# Patient Record
Sex: Female | Born: 2000 | Hispanic: Yes | Marital: Single | Smoking: Never smoker
Health system: Southern US, Community
[De-identification: ages and names within clinical notes are randomized; demographics above are authoritative.]

---
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
## 2018-07-02 IMAGING — DX DG CHEST 2V
2 series · 2 of 2 positions shown · non-contrast
Comparison: Chest radiograph performed 01/27/2010

CLINICAL DATA: Acute onset of generalized chest pain and productive
cough. Initial encounter.

EXAM:
CHEST  2 VIEW

[chest pa]
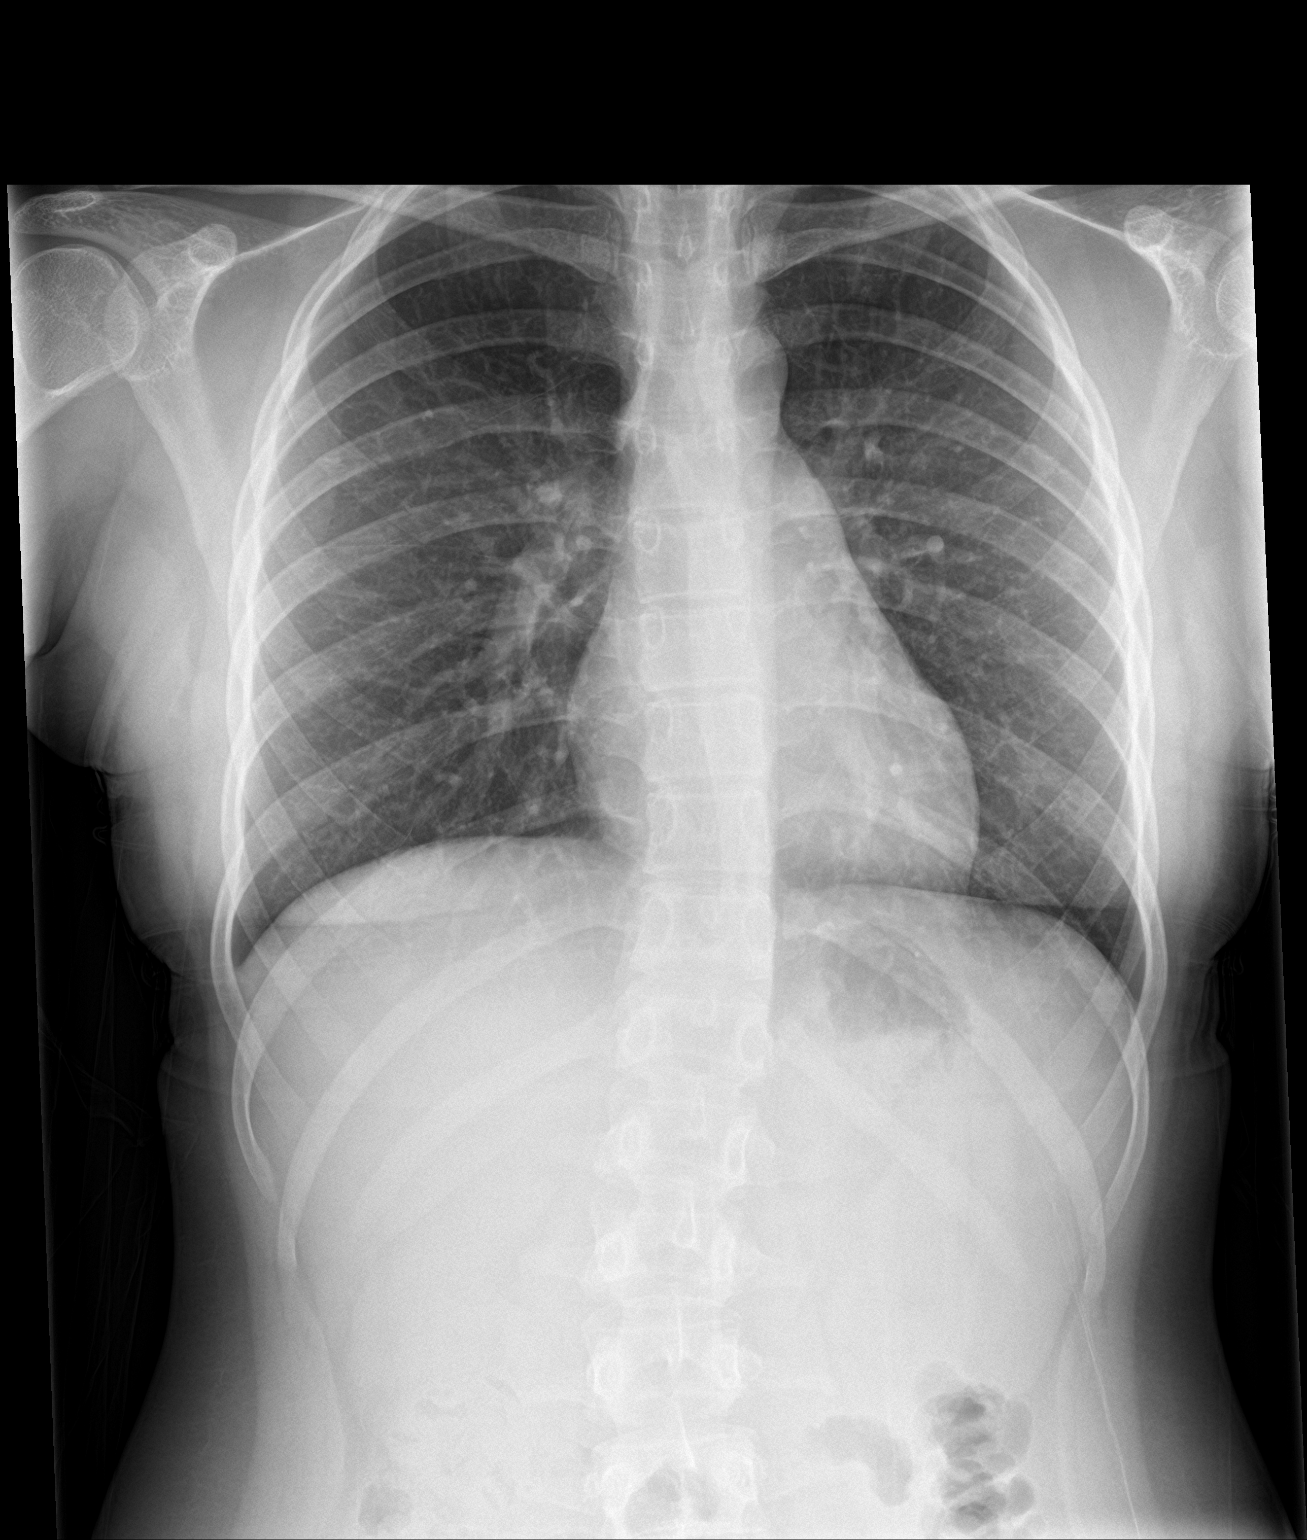

[chest lat]
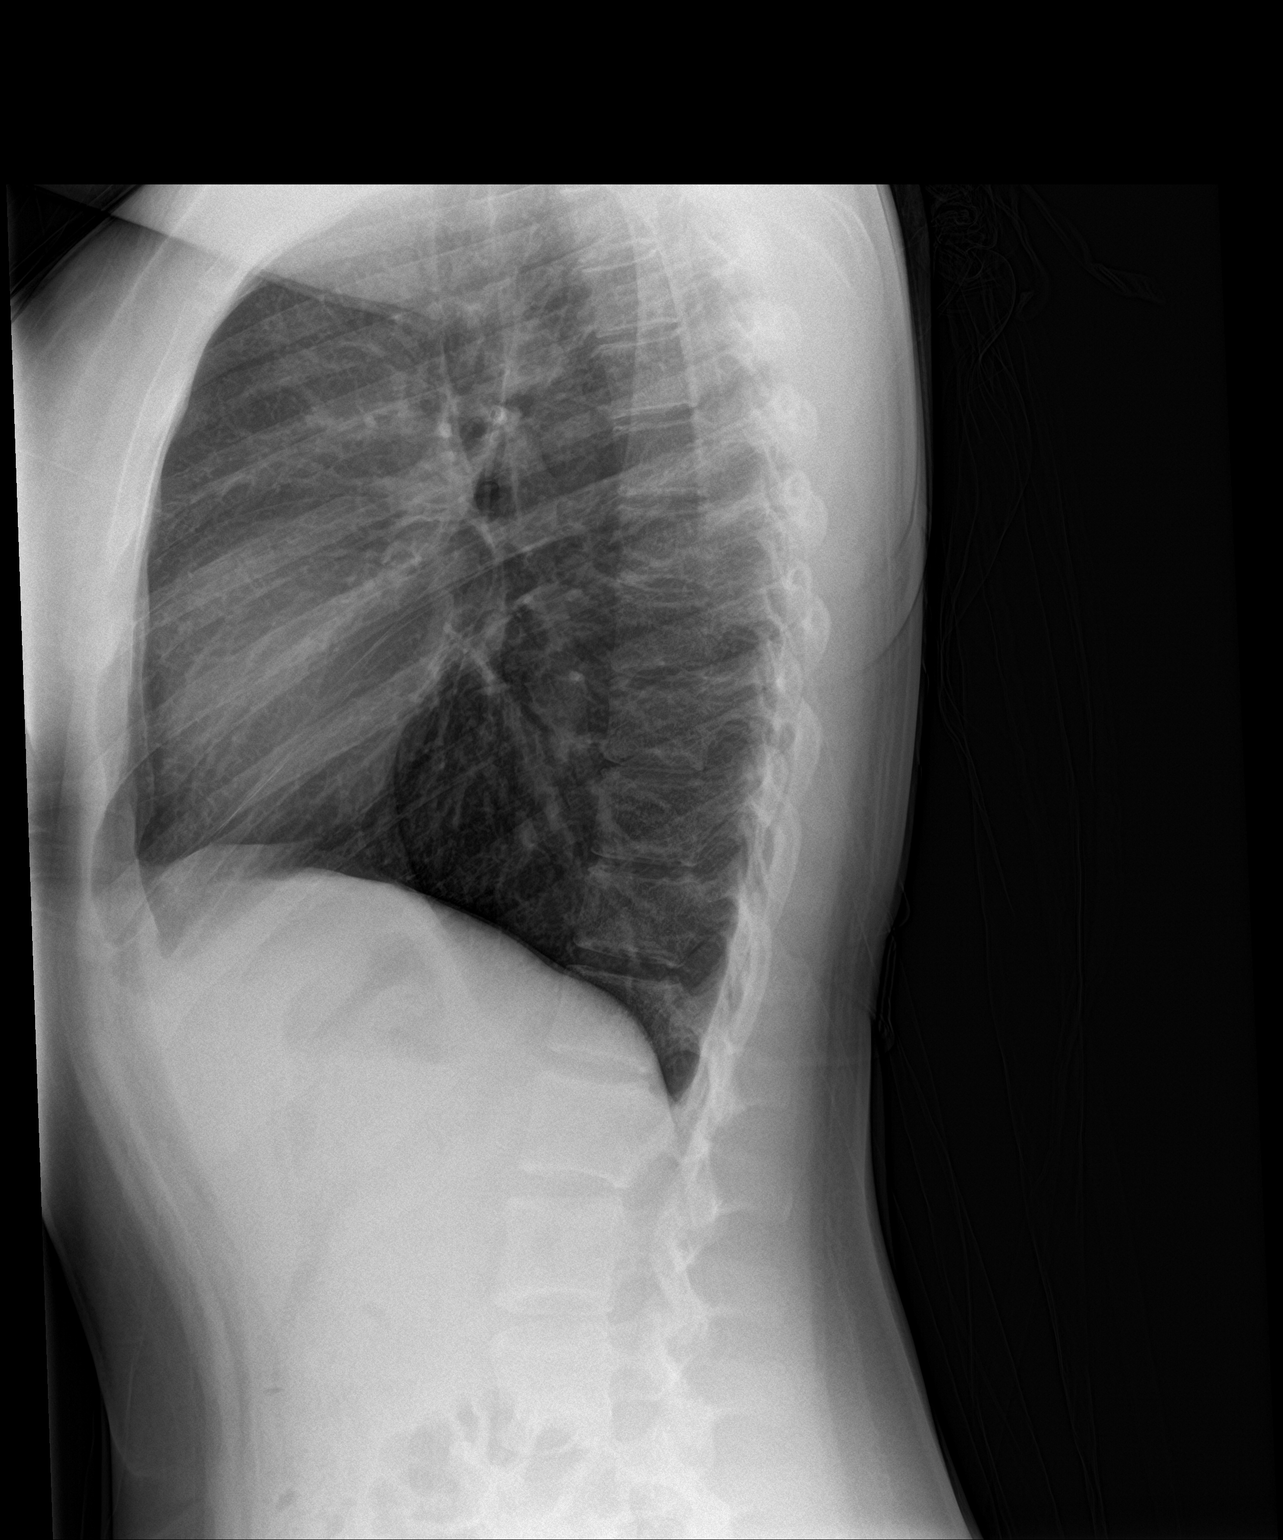

[2 of 2 positions shown; findings below may reference images not displayed]

FINDINGS: The lungs are well-aerated and clear. There is no evidence of focal
opacification, pleural effusion or pneumothorax.

The heart is normal in size; the mediastinal contour is within
normal limits. No acute osseous abnormalities are seen.
IMPRESSION: No acute cardiopulmonary process seen.

## 2021-11-06 ENCOUNTER — Encounter (HOSPITAL_BASED_OUTPATIENT_CLINIC_OR_DEPARTMENT_OTHER): Payer: Self-pay

## 2021-11-06 ENCOUNTER — Emergency Department (HOSPITAL_BASED_OUTPATIENT_CLINIC_OR_DEPARTMENT_OTHER)
Admission: EM | Admit: 2021-11-06 | Discharge: 2021-11-07 | Disposition: A | Discharge status: Home / Self Care | Payer: Medicaid Other | Attending: Emergency Medicine | Admitting: Emergency Medicine

## 2021-11-06 ENCOUNTER — Other Ambulatory Visit: Payer: Self-pay

## 2021-11-06 DIAGNOSIS — K047 Periapical abscess without sinus: Secondary | ICD-10-CM | POA: Diagnosis not present

## 2021-11-06 DIAGNOSIS — K0889 Other specified disorders of teeth and supporting structures: Secondary | ICD-10-CM | POA: Diagnosis present

## 2021-11-06 NOTE — ED Triage Notes (Signed)
Patient here POV from Home.  Endorses Abscess to Upper Mid Anterior Gums. Visited Hydrologist and was given Amoxicillin for Abscess. Has Taken 4 total Doses of Antibiotics. Bleeding and Drainage noted Today which concerned Patient to Seek Ed Evaluation.   No Known Fevers.   NAD Noted during Triage. A&Ox4. GCS 15. Ambulatory.

## 2021-11-07 MED ORDER — CHLORHEXIDINE GLUCONATE 0.12 % MT SOLN: 10.0000 mL | Freq: Four times a day (QID) | OROMUCOSAL | 0 refills | Status: DC

## 2021-11-07 MED ORDER — LIDOCAINE-EPINEPHRINE 2 %-1:100000 IJ SOLN
1.7000 mL | Freq: Once | INTRAMUSCULAR | Status: AC
  Administered 2021-11-07: 1.7 mL via INTRADERMAL
  Filled 2021-11-07: qty 1.7

## 2021-11-07 NOTE — ED Provider Notes (Signed)
[facility] EMERGENCY DEPT Provider Note  CSN: [id number] Arrival date & time: 11/06/21 2124  Chief Complaint(s) Dental Problem  HPI Holly Kelly is a 21 y.o. female who presents to the emergency department for evaluation of purulent drainage from the front incisor.  Patient reports that she was seen by the dentist yesterday to be evaluated for root canal and told she had an infection.  She was prescribed antibiotics.  She had a follow-up appointment earlier in the day today.  Afterwards, she began noticing some swelling in the gumline.  Tonight she began having purulent and bloody discharge from the gumline prompting her visit.  The history is provided by the patient.    Past Medical History History reviewed. No pertinent past medical history. There are no problems to display for this patient.  Home Medication(s) Prior to Admission medications   Medication Sig Start Date End Date Taking? Authorizing Provider  chlorhexidine (PERIDEX) 0.12 % solution Use as directed 10 mLs in the mouth or throat 4 (four) times daily. 11/07/21  Yes Asher Torpey, Amadeo Garnet, MD                                                                                                                                    Allergies Patient has no known allergies.  Review of Systems Review of Systems As noted in HPI  Physical Exam Vital Signs  I have reviewed the triage vital signs BP 123/84   Pulse 64   Temp 99.1 F (37.3 C)   Resp 16   Ht 5' (1.524 m)   Wt 63.5 kg   SpO2 99%   BMI 27.34 kg/m   Physical Exam Vitals reviewed.  Constitutional:      General: She is not in acute distress.    Appearance: She is well-developed. She is not diaphoretic.  HENT:     Head: Normocephalic and atraumatic.     Right Ear: External ear normal.     Left Ear: External ear normal.     Nose: Nose normal.     Mouth/Throat:   Eyes:     General: No scleral icterus.    Conjunctiva/sclera: Conjunctivae  normal.  Neck:     Trachea: Phonation normal.  Cardiovascular:     Rate and Rhythm: Normal rate and regular rhythm.  Pulmonary:     Effort: Pulmonary effort is normal. No respiratory distress.     Breath sounds: No stridor.  Abdominal:     General: There is no distension.  Musculoskeletal:        General: Normal range of motion.     Cervical back: Normal range of motion.  Neurological:     Mental Status: She is alert and oriented to person, place, and time.  Psychiatric:        Behavior: Behavior normal.     ED Results and Treatments Labs (all labs ordered are listed, but only abnormal results are  displayed) Labs Reviewed - No data to display                                                                                                                       EKG  EKG Interpretation  Date/Time:    Ventricular Rate:    PR Interval:    QRS Duration:   QT Interval:    QTC Calculation:   R Axis:     Text Interpretation:         Radiology No results found.  Medications Ordered in ED Medications  lidocaine-EPINEPHrine (XYLOCAINE W/EPI) 2 %-1:100000 (with pres) injection 1.7 mL (1.7 mLs Intradermal Given by Other 11/07/21 0207)                                                                                                                                     Procedures .Marland KitchenIncision and Drainage  Date/Time: 11/07/2021 2:29 AM  Performed by: Nira Conn, MD Authorized by: Nira Conn, MD   Consent:    Consent obtained:  Verbal   Consent given by:  Patient   Risks discussed:  Bleeding   Alternatives discussed:  Alternative treatment Universal protocol:    Procedure explained and questions answered to patient or proxy's satisfaction: yes     Patient identity confirmed:  Verbally with patient and arm band Location:    Type:  Abscess   Location:  Mouth   Mouth location:  Peritonsillar Anesthesia:    Anesthesia method:  Local infiltration   Local  anesthetic:  Lidocaine 2% WITH epi Procedure type:    Complexity:  Simple Procedure details:    Incision types:  Single straight   Incision depth:  Subcutaneous   Drainage:  Bloody   Drainage amount:  Scant   Packing materials:  None Post-procedure details:    Procedure completion:  Tolerated   (including critical care time)  Medical Decision Making / ED Course   Medical Decision Making Risk Prescription drug management.    Dental abscess. Patient given option to I&D versus continue antibiotic.  Patient opted for I&D tonight.  Recommended she continue her prescription antibiotics.  Also provided with prescription for Peridex mouthwash. Dentistry follow-up      Final Clinical Impression(s) / ED Diagnoses Final diagnoses:  None   The patient appears reasonably screened and/or stabilized for discharge and I doubt any other medical condition or other [facility] requiring further  screening, evaluation, or treatment in the ED at this time. I have discussed the findings, Dx and Tx plan with the patient/family who expressed understanding and agree(s) with the plan. Discharge instructions discussed at length. The patient/family was given strict return precautions who verbalized understanding of the instructions. No further questions at time of discharge.  Disposition: Discharge  Condition: Good  ED Discharge Orders          Ordered    chlorhexidine (PERIDEX) 0.12 % solution  4 times daily        11/07/21 0110            Follow Up: Dentist  Call  to schedule an appointment for close follow up           This chart was dictated using voice recognition software.  Despite best efforts to proofread,  errors can occur which can change the documentation meaning.    Nira Conn, MD 11/07/21 [phone]

## 2021-11-07 NOTE — ED Notes (Signed)
Pt placed on monitor, explained delay and awaiting provider assessment. Pt requested water and rn explained that provider would need to see pt first. Denies further needs.

## 2023-07-14 ENCOUNTER — Encounter: Admitting: Nurse Practitioner

## 2023-07-21 ENCOUNTER — Other Ambulatory Visit (HOSPITAL_COMMUNITY)
Admission: RE | Admit: 2023-07-21 | Discharge: 2023-07-21 | Disposition: A | Discharge status: Home / Self Care | Source: Ambulatory Visit | Attending: Nurse Practitioner | Admitting: Nurse Practitioner

## 2023-07-21 ENCOUNTER — Ambulatory Visit: Admitting: Nurse Practitioner

## 2023-07-21 ENCOUNTER — Encounter: Payer: Self-pay | Admitting: Nurse Practitioner

## 2023-07-21 ENCOUNTER — Ambulatory Visit (INDEPENDENT_AMBULATORY_CARE_PROVIDER_SITE_OTHER): Admitting: Nurse Practitioner

## 2023-07-21 VITALS — BP 110/70 | HR 66 | Ht 61.5 in | Wt 139.0 lb

## 2023-07-21 DIAGNOSIS — Z1331 Encounter for screening for depression: Secondary | ICD-10-CM | POA: Diagnosis not present

## 2023-07-21 DIAGNOSIS — Z124 Encounter for screening for malignant neoplasm of cervix: Secondary | ICD-10-CM

## 2023-07-21 DIAGNOSIS — Z01419 Encounter for gynecological examination (general) (routine) without abnormal findings: Secondary | ICD-10-CM | POA: Insufficient documentation

## 2023-07-21 DIAGNOSIS — Z3009 Encounter for other general counseling and advice on contraception: Secondary | ICD-10-CM | POA: Diagnosis not present

## 2023-07-21 NOTE — Progress Notes (Signed)
 Holly Kelly 2000-08-03 [id number]   History:  23 y.o. G1P0010 presents as new patient to establish care. Nexplanon inserted March 2022. Scheduled for removal next week. Does not want to switch to alternative hormonal contraception at this time. Has been on birth control since age 31 and wants to take a break. In long term relationship. Normal pap history.   Gynecologic History No LMP recorded. Patient has had an implant.   Contraception/Family planning: Nexplanon Sexually active: Yes  Health Maintenance Last Pap: 2021. Results were: Normal Last mammogram: Not indicated Last colonoscopy: Not indicated Last Dexa: Not indicated     07/21/2023   11:31 AM  Depression screen PHQ 2/9  Decreased Interest 0  Down, Depressed, Hopeless 0  PHQ - 2 Score 0     Past medical history, past surgical history, family history and social history were all reviewed and documented in the EPIC chart. Long-term boyfriend. Works at [employer]. Lost parents 2 years ago. 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   ROS:  A ROS was performed and pertinent positives and negatives are included.  Exam:  Vitals:   07/21/23 1124  BP: 110/70  Pulse: 66  SpO2: 100%  Weight: 139 lb (63 kg)  Height: 5' 1.5" (1.562 m)   Body mass index is 25.84 kg/m.  General appearance:  Normal Thyroid:  Symmetrical, normal in size, without palpable masses or nodularity. Respiratory  Auscultation:  Clear without wheezing or rhonchi Cardiovascular  Auscultation:  Regular rate, without rubs, murmurs or gallops  Edema/varicosities:  Not grossly evident Abdominal  Soft,nontender, without masses, guarding or rebound.  Liver/spleen:  No organomegaly noted  Hernia:  None appreciated  Skin  Inspection:  Grossly normal Breasts: Not indicated per guidelines Pelvic: External genitalia:  no lesions              Urethra:  normal appearing urethra with no masses, tenderness or lesions              Bartholins and Skenes: normal                  Vagina: normal appearing vagina with normal color and discharge, no lesions              Cervix: no lesions Bimanual Exam:  Uterus:  no masses or tenderness              Adnexa: no mass, fullness, tenderness              Rectovaginal: Deferred              Anus:  normal, no lesions  Patient informed chaperone available to be present for breast and pelvic exam. Patient has requested no chaperone to be present. Patient has been advised what will be completed during breast and pelvic exam.   Assessment/Plan:  23 y.o. G1P0010 to establish care.   Well female exam with routine gynecological exam - Plan: Cytology - PAP( [place]). Education provided on SBEs, importance of preventative screenings, current guidelines, high calcium diet, regular exercise, and multivitamin daily.   Cervical cancer screening - Plan: Cytology - PAP( ). Normal pap history.   General counseling and advice on female contraception - Nexplanon inserted March 2022. Scheduled for removal next week. Does not want to switch to alternative hormonal contraception at this time. Has been on birth control since age 89 and wants to take a break. Provided information on Phexxi.   Return in about 1 year (around  07/20/2024) for Annual.    Andee Bamberger DNP, 11:51 AM 07/21/2023

## 2023-07-24 LAB — CYTOLOGY - PAP
Comment: NEGATIVE
Diagnosis: UNDETERMINED — AB
High risk HPV: NEGATIVE

## 2023-07-28 ENCOUNTER — Ambulatory Visit: Admitting: Nurse Practitioner

## 2023-07-29 ENCOUNTER — Ambulatory Visit: Payer: Self-pay | Admitting: Nurse Practitioner

## 2023-09-24 ENCOUNTER — Ambulatory Visit: Admitting: Nurse Practitioner

## 2023-11-12 ENCOUNTER — Emergency Department (HOSPITAL_BASED_OUTPATIENT_CLINIC_OR_DEPARTMENT_OTHER)

## 2023-11-12 ENCOUNTER — Encounter (HOSPITAL_BASED_OUTPATIENT_CLINIC_OR_DEPARTMENT_OTHER): Payer: Self-pay | Admitting: Emergency Medicine

## 2023-11-12 ENCOUNTER — Emergency Department (HOSPITAL_BASED_OUTPATIENT_CLINIC_OR_DEPARTMENT_OTHER)
Admission: EM | Admit: 2023-11-12 | Discharge: 2023-11-12 | Disposition: A | Discharge status: Home / Self Care | Attending: Emergency Medicine | Admitting: Emergency Medicine

## 2023-11-12 ENCOUNTER — Other Ambulatory Visit: Payer: Self-pay

## 2023-11-12 DIAGNOSIS — M79645 Pain in left finger(s): Secondary | ICD-10-CM | POA: Diagnosis present

## 2023-11-12 DIAGNOSIS — W231XXA Caught, crushed, jammed, or pinched between stationary objects, initial encounter: Secondary | ICD-10-CM | POA: Diagnosis not present

## 2023-11-12 DIAGNOSIS — L03012 Cellulitis of left finger: Secondary | ICD-10-CM | POA: Insufficient documentation

## 2023-11-12 DIAGNOSIS — S6992XA Unspecified injury of left wrist, hand and finger(s), initial encounter: Secondary | ICD-10-CM

## 2023-11-12 DIAGNOSIS — S61303A Unspecified open wound of left middle finger with damage to nail, initial encounter: Secondary | ICD-10-CM | POA: Diagnosis not present

## 2023-11-12 MED ORDER — AMOXICILLIN-POT CLAVULANATE 875-125 MG PO TABS
1.0000 | ORAL_TABLET | Freq: Once | ORAL | Status: AC
  Administered 2023-11-12: 1 via ORAL
  Filled 2023-11-12: qty 1

## 2023-11-12 MED ORDER — AMOXICILLIN-POT CLAVULANATE 875-125 MG PO TABS: 1.0000 | ORAL_TABLET | Freq: Two times a day (BID) | ORAL | 0 refills | Status: AC

## 2023-11-12 NOTE — ED Provider Notes (Signed)
 [place] EMER[facility]ENCY DEPARTMENT AT [facility] Provider Note   CSN: [id number] Arrival date & time: 11/12/23  2009     Patient presents with: Finger Injury   Holly Kelly is a 23 y.o. female reportedly otherwise healthy presents to the emerged from today for evaluation of left middle finger nail pain.  She reports that on Saturday, 5 days previously, she had her acrylic nail stuck in between the door which ripped off her acrylic nail and her natural nail.  She reports that she is including the area with hydroperoxide daily.  She notes that she was having some swelling and was having some increase in pain and was concerned that there may have been an infection.  Reports that she is feeling some tingling to the area.  Denies any fevers.  She denies any drainage from the area.  HPI     Prior to Admission medications   Medication Sig Start Date End Date Taking? Authorizing Provider  etonogestrel (NEXPLANON) 68 MG IMPL implant 1 each by Subdermal route once.    [provider]    Allergies: Patient has no known allergies.    Review of Systems  Constitutional:  Negative for chills and fever.  See HPI  Updated Vital Signs BP 117/85 (BP Location: Right Arm)   Pulse 79   Temp 98.1 F (36.7 C) (Oral)   Resp 20   Ht 5' 0.5 (1.537 m)   Wt 57.6 kg   SpO2 97%   BMI 24.39 kg/m   Physical Exam Vitals and nursing note reviewed.  Constitutional:      General: She is not in acute distress.    Appearance: She is not ill-appearing or toxic-appearing.  Eyes:     General: No scleral icterus. Pulmonary:     Effort: Pulmonary effort is normal. No respiratory distress.  Musculoskeletal:        General: Signs of injury present.     Comments: Please see image.  Patient does have some swelling just proximal to the nailbed on the left middle finger.  No swelling or skin changes noted to the pad of the finger.  Compartments are soft.  She has full flexion extension of  the finger.  Cap refill less than 2 seconds.  Nailbed appears dry.  On gentle palpation of the more swollen/fluctuant area, was able to get a small amount of purulence to drain.  Skin:    General: Skin is warm and dry.     Capillary Refill: Capillary refill takes less than 2 seconds.  Neurological:     Mental Status: She is alert.        (all labs ordered are listed, but only abnormal results are displayed) Labs Reviewed - No data to display  EKG: None  Radiology: DG Finger Middle Left Result Date: 11/12/2023 CLINICAL DATA:  Middle finger injury swelling at the tip EXAM: LEFT MIDDLE FINGER 2+V COMPARISON:  None Available. FINDINGS: There is no evidence of fracture or dislocation. There is no evidence of arthropathy or other focal bone abnormality. Soft tissues are unremarkable. IMPRESSION: Negative. Electronically Signed   By: Luke Bun M.D.   On: 11/12/2023 20:41   Procedures   Medications Ordered in the ED - No data to display                              Medical Decision Making Amount and/or Complexity of Data Reviewed Radiology: ordered.  Risk  Prescription drug management.   23 y.o. female presents to the ER for evaluation of left middle finger pain. Differential diagnosis includes but is not limited to paronychia, soft tissue infection, open fracture, nailbed injury. Vital signs unremarkable. Physical exam as noted above.   X-ray imaging is negative. Per radiologist's interpretation.    Was able to get a small amount of purulence to express from the area.  I the patient soak the finger in warm water and she reports that a small amount of fluid came out of the finger as well during this.  On reevaluation of the finger, the swelling is improved significantly.  There is no longer any fluctuance present to the area.  No further purulence expressed upon palpation.  The pulp of the finger is soft.  There is no overlying skin changes.  This compartments soft.  Doubt any  felon.  Given the negative x-ray, no open fracture.  The patient has had an exposed nail bed for 5 days.  She did not save the nail.  Will provide patient a Xeroform bandage with gauze and splint.  She reports that she uses be going to the beach in the few days.  We discussed the dangers of ocean water into open wounds and risk for infection and devised her against this.  We discussed to stop using hydroperoxide for cleaning and recommended soaks with warm water, Epsom salt, and antibacterial soap.  We discussed wound care and further management.  I given her the information for hand surgeon to follow-up with as needed.  I have also placed her on an antibiotic with first dose given here.  She stable for discharge home with strict return precautions.  We discussed the results of the labs/imaging. The plan is wound care, antibiotic, follow-up with hand surgery as needed. We discussed strict return precautions and red flag symptoms. The patient verbalized their understanding and agrees to the plan. The patient is stable and being discharged home in good condition.  I discussed this case with my attending physician who cosigned this note including patient's presenting symptoms, physical exam, and planned diagnostics and interventions. Attending physician stated agreement with plan or made changes to plan which were implemented.   Portions of this report may have been transcribed using voice recognition software. Every effort was made to ensure accuracy; however, inadvertent computerized transcription errors may be present.    Final diagnoses:  Injury to fingernail of left hand, initial encounter  Paronychia of finger of left hand    ED Discharge Orders          Ordered    amoxicillin -clavulanate (AUGMENTIN ) 875-125 MG tablet  Every 12 hours        11/12/23 2340               Bernis Ernst, PA-C 11/13/23 1815    Tegeler, Lonni PARAS, MD 11/16/23 [phone]

## 2023-11-12 NOTE — Discharge Instructions (Addendum)
 You were seen in the emerged from today for evaluation of your finger.  You have a injury to your nailbed as well as a paronychia.  I have included additional information in addition paperwork on this for you to review.  You need to take an antibiotic twice a day for the next 7 days.  Continue with doing Epsom salt soaks with some antibacterial soap and warm water for about 15 minutes once to twice a day.  Please make sure the area is covered with your splint to avoid further damage to the nailbed.  I have included an ration for a hand surgeon for you to follow-up with.  If you start to have any fever, worsening of pain, swelling, discoloration, red streaking, please return to the ER.  If you have any other concerns for any worsening symptoms, please return to nearest department for reevaluation. Do not expose the wound to any dishwater, pools, lakes, oceans, [employer], dirt or grime. Keeping the wound clean and away from contamination can help ensure good wound healing and help to prevent infections. For pain, I recommend Tylenol 1000mg  and/or ibuprofen  600mg  every 6 hours as needed for pain. Do not take ibuprofen  if you are concerned for pregnancy.   Contact a doctor if: You feel worse. You do not get better. You keep having or you have more fluid, blood, or pus coming from the affected area. Your affected finger, toe, or joint gets swollen or hard to move. You have a fever or chills. There is redness spreading from the affected area.

## 2023-11-12 NOTE — ED Triage Notes (Signed)
 Pt smashed 3rd finger of left hand, nail is missing. Happened Saturday.
